# Patient Record
Sex: Male | Born: 1985
Health system: Southern US, Community
[De-identification: ages and names within clinical notes are randomized; demographics above are authoritative.]

---
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
## 2008-04-15 ENCOUNTER — Ambulatory Visit: Payer: Self-pay | Admitting: Family Medicine

## 2008-04-23 ENCOUNTER — Ambulatory Visit: Payer: Self-pay | Admitting: Internal Medicine

## 2009-03-14 ENCOUNTER — Emergency Department: Payer: Self-pay | Admitting: Emergency Medicine

## 2010-11-18 ENCOUNTER — Encounter: Payer: Self-pay | Admitting: Family Medicine

## 2010-11-18 ENCOUNTER — Ambulatory Visit
Admission: RE | Admit: 2010-11-18 | Discharge: 2010-11-18 | Payer: Self-pay | Source: Home / Self Care | Attending: Family Medicine | Admitting: Family Medicine

## 2010-12-22 NOTE — Letter (Signed)
Summary: [facility] form  [facility] form   Imported By: Dorna Leitz 11/19/2010 14:14:04  _____________________________________________________________________  External Attachment:    Type:   Image     Comment:   External Document

## 2010-12-22 NOTE — Assessment & Plan Note (Signed)
Summary: PPD Results  Nurse Visit   PPD Results    Date of reading: 11/18/2010    Results: < 5mm    Interpretation: negative

## 2021-05-03 ENCOUNTER — Other Ambulatory Visit: Payer: Self-pay

## 2021-05-03 ENCOUNTER — Encounter: Payer: Self-pay | Admitting: *Deleted

## 2021-05-03 ENCOUNTER — Emergency Department: Payer: BC Managed Care – PPO

## 2021-05-03 ENCOUNTER — Emergency Department
Admission: EM | Admit: 2021-05-03 | Discharge: 2021-05-03 | Disposition: A | Discharge status: Home / Self Care | Payer: BC Managed Care – PPO | Attending: Emergency Medicine | Admitting: Emergency Medicine

## 2021-05-03 DIAGNOSIS — S51852A Open bite of left forearm, initial encounter: Secondary | ICD-10-CM | POA: Insufficient documentation

## 2021-05-03 DIAGNOSIS — W540XXA Bitten by dog, initial encounter: Secondary | ICD-10-CM | POA: Diagnosis not present

## 2021-05-03 DIAGNOSIS — S8991XA Unspecified injury of right lower leg, initial encounter: Secondary | ICD-10-CM | POA: Diagnosis not present

## 2021-05-03 DIAGNOSIS — S60812A Abrasion of left wrist, initial encounter: Secondary | ICD-10-CM | POA: Diagnosis not present

## 2021-05-03 DIAGNOSIS — Y9301 Activity, walking, marching and hiking: Secondary | ICD-10-CM | POA: Diagnosis not present

## 2021-05-03 DIAGNOSIS — S59912A Unspecified injury of left forearm, initial encounter: Secondary | ICD-10-CM | POA: Diagnosis present

## 2021-05-03 DIAGNOSIS — M25561 Pain in right knee: Secondary | ICD-10-CM

## 2021-05-03 DIAGNOSIS — Z23 Encounter for immunization: Secondary | ICD-10-CM | POA: Diagnosis not present

## 2021-05-03 MED ORDER — HYDROCODONE-ACETAMINOPHEN 5-325 MG PO TABS: 1.0000 | ORAL_TABLET | Freq: Four times a day (QID) | ORAL | 0 refills | Status: AC | PRN

## 2021-05-03 MED ORDER — BACITRACIN ZINC 500 UNIT/GM EX OINT
TOPICAL_OINTMENT | Freq: Two times a day (BID) | CUTANEOUS | Status: DC
  Administered 2021-05-03: 1 via TOPICAL
  Filled 2021-05-03: qty 0.9

## 2021-05-03 MED ORDER — DOXYCYCLINE MONOHYDRATE 100 MG PO TABS: 100.0000 mg | ORAL_TABLET | Freq: Two times a day (BID) | ORAL | 0 refills | Status: AC

## 2021-05-03 MED ORDER — TETANUS-DIPHTH-ACELL PERTUSSIS 5-2.5-18.5 LF-MCG/0.5 IM SUSY
0.5000 mL | PREFILLED_SYRINGE | Freq: Once | INTRAMUSCULAR | Status: AC
  Administered 2021-05-03: 0.5 mL via INTRAMUSCULAR
  Filled 2021-05-03: qty 0.5

## 2021-05-03 MED ORDER — HYDROCODONE-ACETAMINOPHEN 5-325 MG PO TABS
1.0000 | ORAL_TABLET | ORAL | Status: AC
  Administered 2021-05-03: 1 via ORAL
  Filled 2021-05-03: qty 1

## 2021-05-03 MED ORDER — METRONIDAZOLE 500 MG PO TABS: 500.0000 mg | ORAL_TABLET | Freq: Three times a day (TID) | ORAL | 0 refills | Status: AC

## 2021-05-03 MED ORDER — DOXYCYCLINE HYCLATE 100 MG PO TABS
100.0000 mg | ORAL_TABLET | Freq: Once | ORAL | Status: AC
  Administered 2021-05-03: 100 mg via ORAL
  Filled 2021-05-03: qty 1

## 2021-05-03 MED ORDER — METRONIDAZOLE 500 MG PO TABS
500.0000 mg | ORAL_TABLET | Freq: Once | ORAL | Status: AC
  Administered 2021-05-03: 500 mg via ORAL
  Filled 2021-05-03: qty 1

## 2021-05-03 NOTE — ED Notes (Signed)
Pt reports dog bite while out walking, st incident has been reported to [employer] sheriff's department

## 2021-05-03 NOTE — ED Provider Notes (Signed)
[facility] EMERGENCY DEPARTMENT Provider Note   CSN: [id number] Arrival date & time: 05/03/21  2125     History Chief Complaint  Patient presents with   Animal Bite    Stephen Colon is a 35 y.o. male presents to the emergency department for evaluation of dog bite to the left forearm.  Just earlier today patient was going for a jog, was attacked by a pit bull.  The dog lunged and bit his left forearm.  He has some abrasions to the dorsal aspect of the left wrist and distal forearm.  No swelling, numbness or tingling.  Tetanus status unknown.  Dog returned a couple more times in the process patient was running and injured his right knee.  Denies falling or landing on the knee, was able to continue walking due to adrenaline but is currently stating the pain in the knee is very severe he has a hard time walking and is having some swelling.  His pain is located along the patella region.  He is able to straight leg raise.  Denies any other injury to his body.  No numbness or tingling.  He has not any medications for pain.  HPI     No past medical history on file.  There are no problems to display for this patient.     No family history on file.  Social History   Tobacco Use   Smoking status: Never   Smokeless tobacco: Never    Home Medications Prior to Admission medications   Medication Sig Start Date End Date Taking? Authorizing Provider  doxycycline (ADOXA) 100 MG tablet Take 1 tablet (100 mg total) by mouth 2 (two) times daily for 7 days. 05/03/21 05/10/21 Yes Evon Slack, PA-C  HYDROcodone-acetaminophen (NORCO) 5-325 MG tablet Take 1 tablet by mouth every 6 (six) hours as needed for moderate pain. 05/03/21  Yes Evon Slack, PA-C  metroNIDAZOLE (FLAGYL) 500 MG tablet Take 1 tablet (500 mg total) by mouth 3 (three) times daily for 7 days. 05/03/21 05/10/21 Yes Evon Slack, PA-C    Allergies    Keflex [cephalexin] and Penicillins  Review of  Systems   Review of Systems  Constitutional:  Negative for fever.  Gastrointestinal:  Negative for nausea and vomiting.  Musculoskeletal:  Positive for arthralgias, gait problem and joint swelling.  Skin:  Positive for wound. Negative for color change and pallor.  Neurological:  Negative for numbness and headaches.   Physical Exam Updated Vital Signs BP (!) 145/78   Pulse 79   Temp 99.2 F (37.3 C) (Oral)   Resp 20   Ht 6\' 2"  (1.88 m)   Wt 120.2 kg   SpO2 98%   BMI 34.02 kg/m   Physical Exam Constitutional:      Appearance: He is well-developed.  HENT:     Head: Normocephalic and atraumatic.  Eyes:     Conjunctiva/sclera: Conjunctivae normal.  Cardiovascular:     Rate and Rhythm: Normal rate.  Pulmonary:     Effort: Pulmonary effort is normal. No respiratory distress.  Musculoskeletal:     Cervical back: Normal range of motion.     Comments: Examination of the left forearm shows 3 inch wide area of abrasions with small superficial puncture wounds to the dorsal aspect of the left distal forearm.  Mild soft tissue swelling and tenderness noted.  Full range of motion of the extensor tendons.  Compartments are soft. Examination of the right knee shows swelling with ability  to straight leg raise but with a lot of pain.  He is tender along the dorsal aspect of the patella as well as just superior to the patella along the quad tendon.  There is no palpable defect of the quad tendon.  Patellar tendon is intact.  He is only able to flex knee past 30 degrees without causing severe pain.  He has no tenderness along the mid femur up to the hip.  Neurovascular intact in right lower extremity.  Skin:    General: Skin is warm.     Findings: No rash.  Neurological:     Mental Status: He is alert and oriented to person, place, and time.  Psychiatric:        Behavior: Behavior normal.        Thought Content: Thought content normal.    ED Results / Procedures / Treatments   Labs (all  labs ordered are listed, but only abnormal results are displayed) Labs Reviewed - No data to display  EKG None  Radiology DG Forearm Left  Result Date: 05/03/2021 CLINICAL DATA:  Dog bite to forearm EXAM: LEFT FOREARM - 2 VIEW COMPARISON:  None. FINDINGS: There is no evidence of fracture or other focal bone lesions. Soft tissues are unremarkable. No radiopaque foreign body or soft tissue gas. IMPRESSION: Negative. Electronically Signed   By: Charlett Nose M.D.   On: 05/03/2021 22:32   DG Knee Complete 4 Views Right  Result Date: 05/03/2021 CLINICAL DATA:  Right knee pain, trauma EXAM: RIGHT KNEE - COMPLETE 4+ VIEW COMPARISON:  None. FINDINGS: No evidence of fracture, dislocation, or joint effusion. No evidence of arthropathy or other focal bone abnormality. Soft tissues are unremarkable. IMPRESSION: Negative. Electronically Signed   By: Charlett Nose M.D.   On: 05/03/2021 22:32    Procedures   Medications Ordered in ED Medications  bacitracin ointment (has no administration in time range)  metroNIDAZOLE (FLAGYL) tablet 500 mg (has no administration in time range)  Tdap (BOOSTRIX) injection 0.5 mL (0.5 mLs Intramuscular Given 05/03/21 2228)  doxycycline (VIBRA-TABS) tablet 100 mg (100 mg Oral Given 05/03/21 2227)  HYDROcodone-acetaminophen (NORCO/VICODIN) 5-325 MG per tablet 1 tablet (1 tablet Oral Given 05/03/21 2227)    ED Course  I have reviewed the triage vital signs and the nursing notes.  Pertinent labs & imaging results that were available during my care of the patient were reviewed by me and considered in my medical decision making (see chart for details).    MDM Rules/Calculators/A&P                          35 year old male with dog bite earlier today to the left forearm.  Superficial abrasions to the dorsal aspect of the forearm.  X-rays negative.  No foreign body.  Extensor mechanism is intact.  Patient tetanus updated, patient started on metronidazole and doxycycline.   During the struggle with the dog he injured his right knee.  X-rays negative and extensor mechanisms intact.  No ligamentous laxity but having some pain throughout the knee.  We will give him crutches, recommend rest ice elevation.  He will follow-up with his orthopedist at [facility].  He understands signs symptoms return to the ER for.  Animal control has been notified and dog is going to be able to be quarantined. Final Clinical Impression(s) / ED Diagnoses Final diagnoses:  Dog bite, initial encounter  Dog bite of left forearm, initial encounter  Acute pain of right  knee    Rx / DC Orders ED Discharge Orders          Ordered    doxycycline (ADOXA) 100 MG tablet  2 times daily        05/03/21 2256    HYDROcodone-acetaminophen (NORCO) 5-325 MG tablet  Every 6 hours PRN        05/03/21 2256    metroNIDAZOLE (FLAGYL) 500 MG tablet  3 times daily        05/03/21 2256             Ronnette Juniper 05/03/21 2307    Phineas Semen, MD 05/04/21 1555

## 2021-05-03 NOTE — ED Triage Notes (Signed)
Pt has superficial dog bite to right arm.  Pt states he was on a walk and a pitbull attacked him.  Pt has right knee pain.  Pt alert

## 2021-05-03 NOTE — Discharge Instructions (Addendum)
Please apply antibiotic ointment to the left forearm daily.  Use crutches as needed for ambulation, rest ice and elevate the right knee.  You may work on gentle knee range of motion as swelling and pain allows.  You may discontinue crutches if you are able to walk without a limp in the next few days.  Please call your orthopedist or Dr. Joice Lofts office in 1 to 2 days to schedule follow-up appointment.  Return to the ER for any worsening symptoms or any changes in your health such as increased pain, swelling, warmth, redness

## 2022-01-30 ENCOUNTER — Other Ambulatory Visit: Payer: Self-pay

## 2022-01-30 ENCOUNTER — Ambulatory Visit
Admission: EM | Admit: 2022-01-30 | Discharge: 2022-01-30 | Disposition: A | Discharge status: Home / Self Care | Payer: BC Managed Care – PPO | Attending: Emergency Medicine | Admitting: Emergency Medicine

## 2022-01-30 ENCOUNTER — Ambulatory Visit: Payer: Self-pay

## 2022-01-30 DIAGNOSIS — L237 Allergic contact dermatitis due to plants, except food: Secondary | ICD-10-CM | POA: Diagnosis not present

## 2022-01-30 MED ORDER — PREDNISONE 10 MG (21) PO TBPK: ORAL_TABLET | Freq: Every day | ORAL | 0 refills | Status: AC

## 2022-01-30 NOTE — Discharge Instructions (Signed)
Take  prednisone every morning with food acute on packaging, this medicine is to reduce inflammation which in turn will help clear the rash ? ?You may continue use of topical calamine to help with itching, you may also attempt use of oral Zyrtec or Claritin or Benadryl ? ?You may follow-up with urgent care if rash continues to persist or worsens ?

## 2022-01-30 NOTE — ED Triage Notes (Signed)
Pt presents for rash all over his arms.He thinks it may be poison ivy.  ?

## 2022-01-30 NOTE — ED Provider Notes (Signed)
?[facility] ? ? ? ?CSN: [id number] ?Arrival date & time: 01/30/22  1153 ? ? ?  ? ?History   ?Chief Complaint ?Chief Complaint  ?Patient presents with  ? Rash  ? ? ?HPI ?Stephen Colon is a 36 y.o. male.  ? ?Patient presents since with a pruritic erythematous blister like rash present on the bilateral arms for 1 week.  Rash is spread to the left chest wall.  Symptoms began after working in a wooded area with [employer] on.  Endorses that symptoms are worsening.  Has attempted use of calamine and which hazel which has been minimally helpful. ? ? ? ? ? ?History reviewed. No pertinent past medical history. ? ?There are no problems to display for this patient. ? ? ?History reviewed. No pertinent surgical history. ? ? ? ? ?Home Medications   ? ?Prior to Admission medications   ?Medication Sig Start Date End Date Taking? Authorizing Provider  ?HYDROcodone-acetaminophen (NORCO) 5-325 MG tablet Take 1 tablet by mouth every 6 (six) hours as needed for moderate pain. 05/03/21   Evon Slack, PA-C  ? ? ?Family History ?History reviewed. No pertinent family history. ? ?Social History ?Social History  ? ?Tobacco Use  ? Smoking status: Never  ? Smokeless tobacco: Never  ? ? ? ?Allergies   ?Keflex [cephalexin] and Penicillins ? ? ?Review of Systems ?Review of Systems  ?Constitutional: Negative.   ?Respiratory: Negative.    ?Cardiovascular: Negative.   ?Skin:  Positive for rash. Negative for color change, pallor and wound.  ?Neurological: Negative.   ? ? ?Physical Exam ?Triage Vital Signs ?ED Triage Vitals [01/30/22 1212]  ?Enc Vitals Group  ?   BP (!) 148/134  ?   Pulse Rate (!) 58  ?   Resp 16  ?   Temp 98.3 ?F (36.8 ?C)  ?   Temp Source Oral  ?   SpO2 100 %  ?   Weight   ?   Height   ?   Head Circumference   ?   Peak Flow   ?   Pain Score   ?   Pain Loc   ?   Pain Edu?   ?   Excl. in GC?   ? ?No data found. ? ?Updated Vital Signs ?BP (!) 139/96 (BP Location: Left Arm)   Pulse (!) 56   Temp 98.3 ?F  (36.8 ?C) (Oral)   Resp 16   SpO2 100%  ? ?Visual Acuity ?Right Eye Distance:   ?Left Eye Distance:   ?Bilateral Distance:   ? ?Right Eye Near:   ?Left Eye Near:    ?Bilateral Near:    ? ?Physical Exam ?Constitutional:   ?   Appearance: Normal appearance.  ?HENT:  ?   Head: Normocephalic.  ?Eyes:  ?   Extraocular Movements: Extraocular movements intact.  ?Pulmonary:  ?   Effort: Pulmonary effort is normal.  ?Skin: ?   Comments: Erythematous blistering papular rash present to the anterior and medial aspect of the left upper arm right forearm and left chest wall  ?Neurological:  ?   Mental Status: He is alert and oriented to person, place, and time. Mental status is at baseline.  ?Psychiatric:     ?   Mood and Affect: Mood normal.     ?   Behavior: Behavior normal.  ? ? ? ?UC Treatments / Results  ?Labs ?(all labs ordered are listed, but only abnormal results are displayed) ?Labs Reviewed - No data  to display ? ?EKG ? ? ?Radiology ?No results found. ? ?Procedures ?Procedures (including critical care time) ? ?Medications Ordered in UC ?Medications - No data to display ? ?Initial Impression / Assessment and Plan / UC Course  ?I have reviewed the triage vital signs and the nursing notes. ? ?Pertinent labs & imaging results that were available during my care of the patient were reviewed by me and considered in my medical decision making (see chart for details). ? ?Poison ivy dermatitis ? ?Prednisone 60 mg taper prescribed for management, recommended continued use of topical calamine and which able as well as oral antihistamine for management of itching, patient encouraged to cover rash to prevent spread in household, given strict precautions that if symptoms continue to persist or worsen to follow-up with urgent ?Final Clinical Impressions(s) / UC Diagnoses  ? ?Final diagnoses:  ?None  ? ?Discharge Instructions   ?None ?  ? ?ED Prescriptions   ?None ?  ? ?PDMP not reviewed this encounter. ?  ?Valinda Hoar,  NP ?01/30/22 1253 ? ?

## 2023-01-11 IMAGING — DX DG KNEE COMPLETE 4+V*R*
4 series · 4 of 4 positions shown · non-contrast
Comparison: None.

CLINICAL DATA: Right knee pain, trauma

EXAM:
RIGHT KNEE - COMPLETE 4+ VIEW

[knee ap]
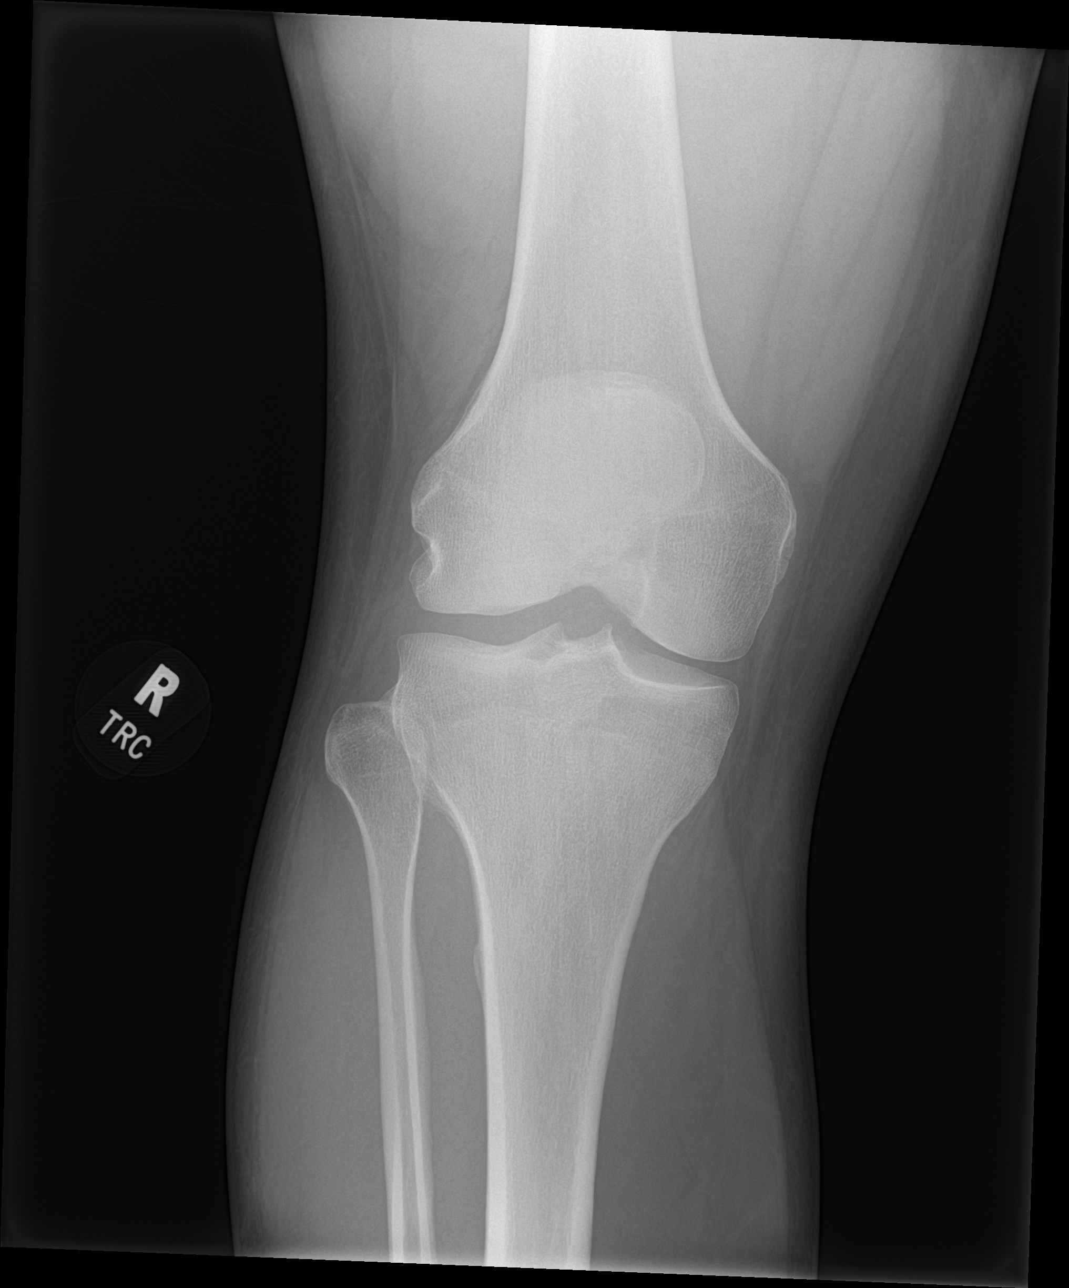

[knee obl (1 of 2)]
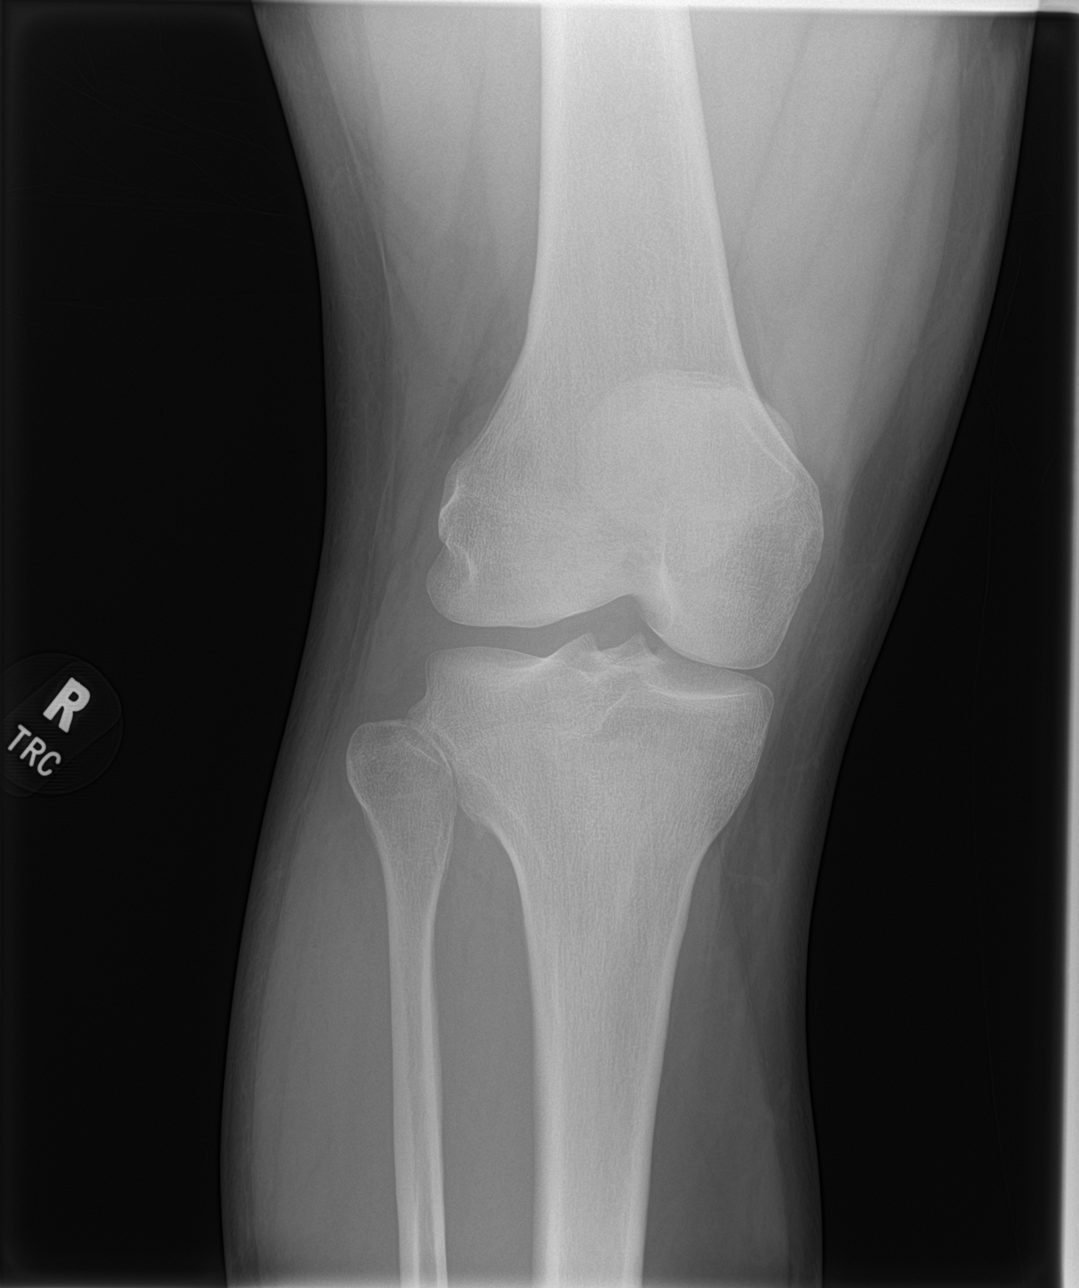

[knee obl (2 of 2)]
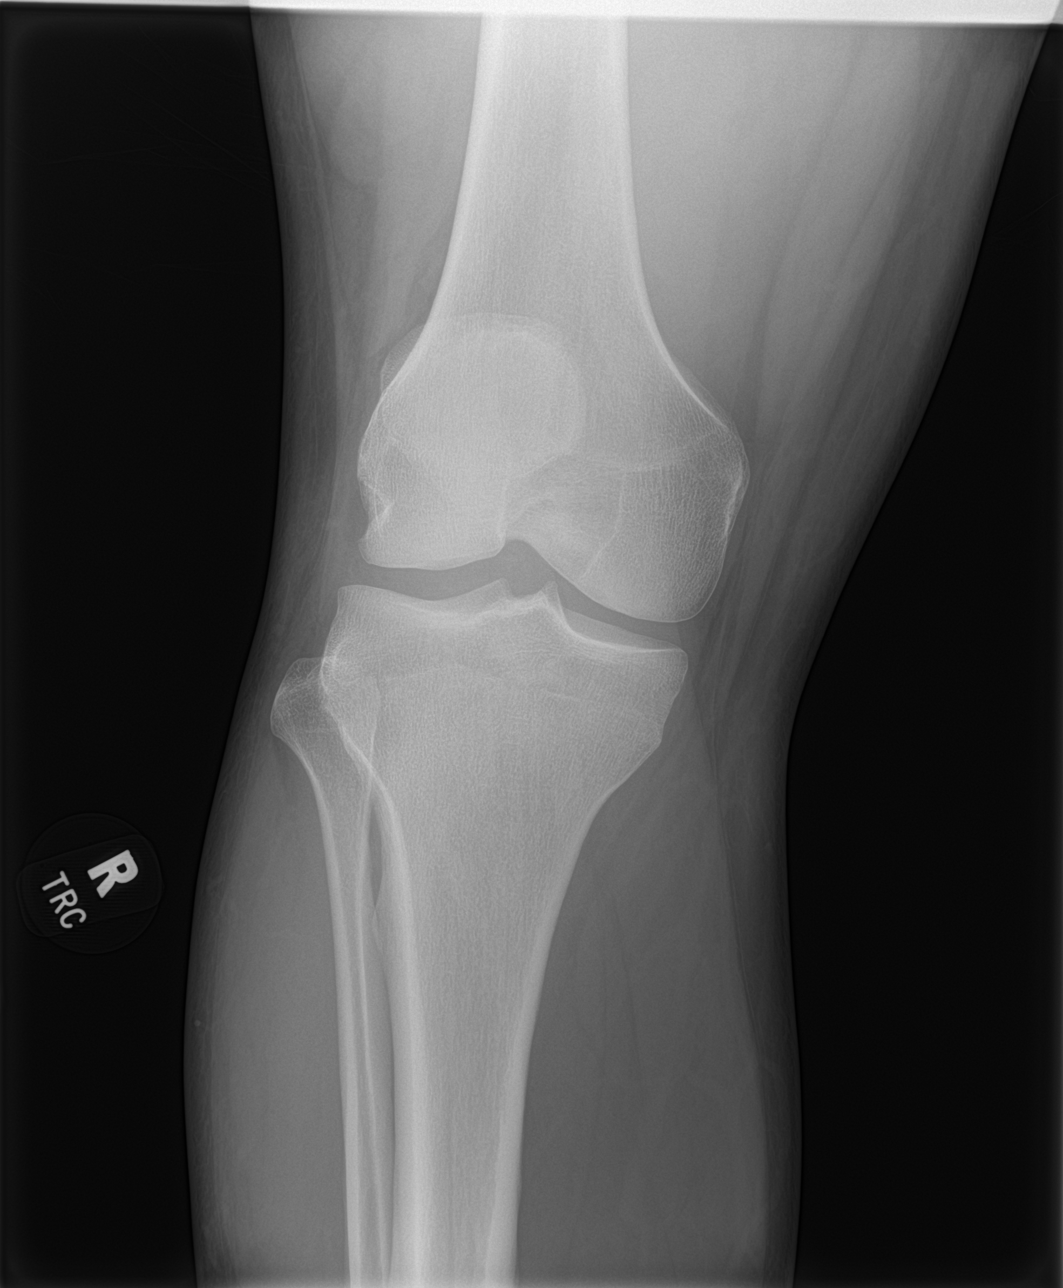

[knee lat]
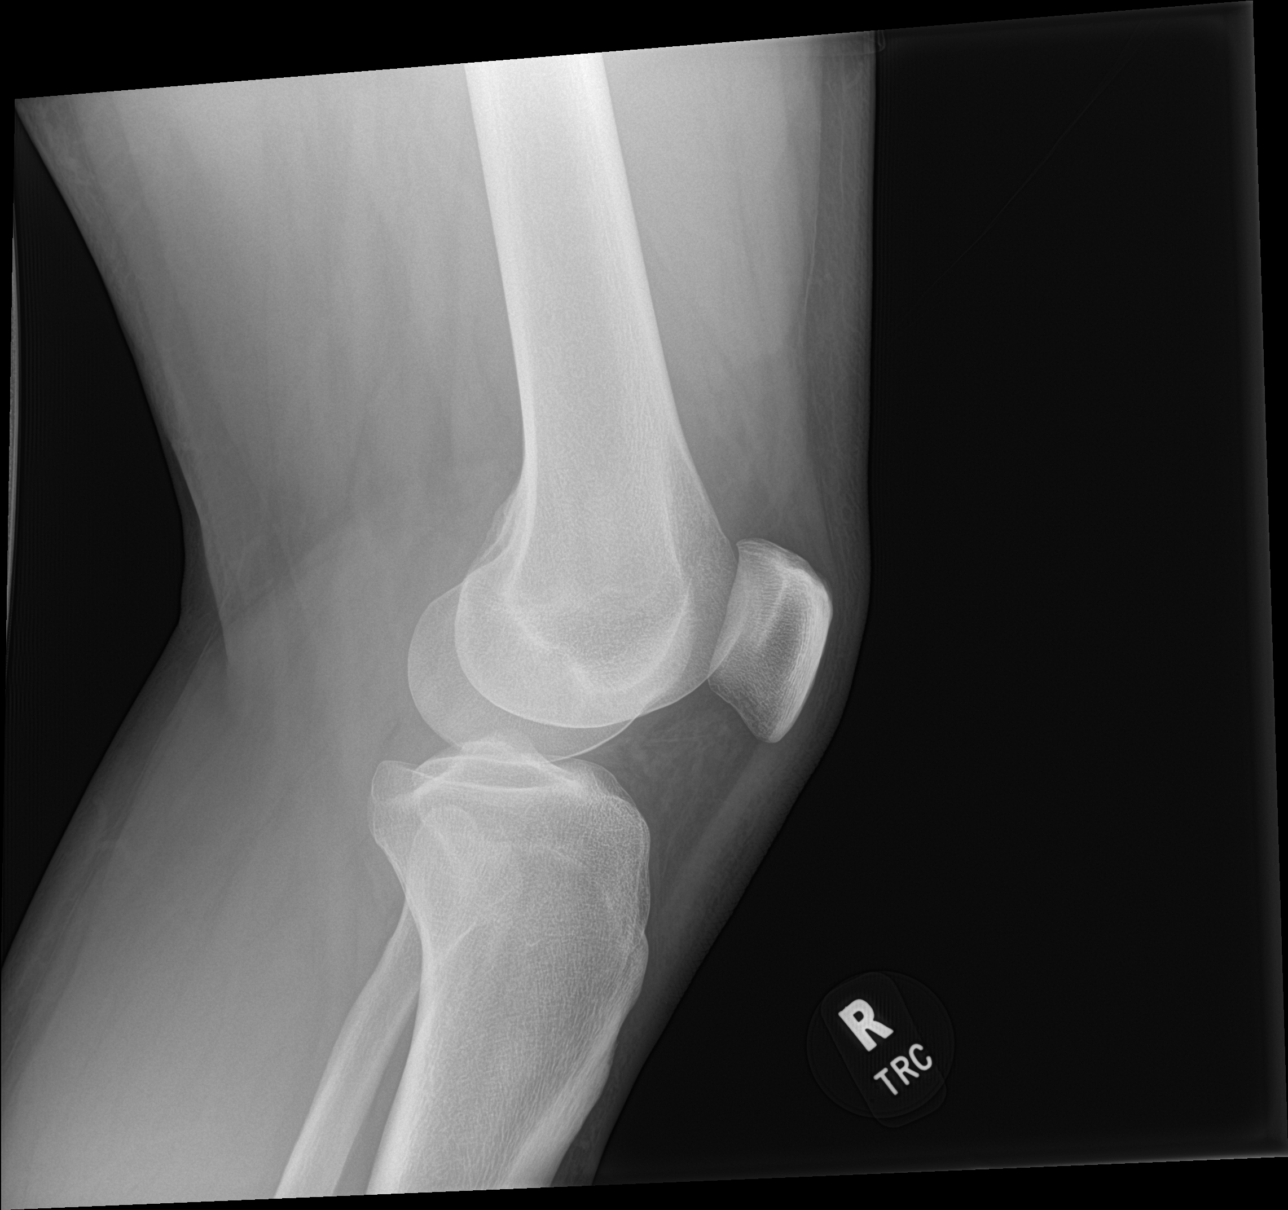

[4 of 4 positions shown; findings below may reference images not displayed]

FINDINGS: No evidence of fracture, dislocation, or joint effusion. No evidence
of arthropathy or other focal bone abnormality. Soft tissues are
unremarkable.
IMPRESSION: Negative.

## 2023-01-11 IMAGING — DX DG FOREARM 2V*L*
2 series · 2 of 2 positions shown · non-contrast
Comparison: None.

CLINICAL DATA: Dog bite to forearm

EXAM:
LEFT FOREARM - 2 VIEW

[forearm ap]
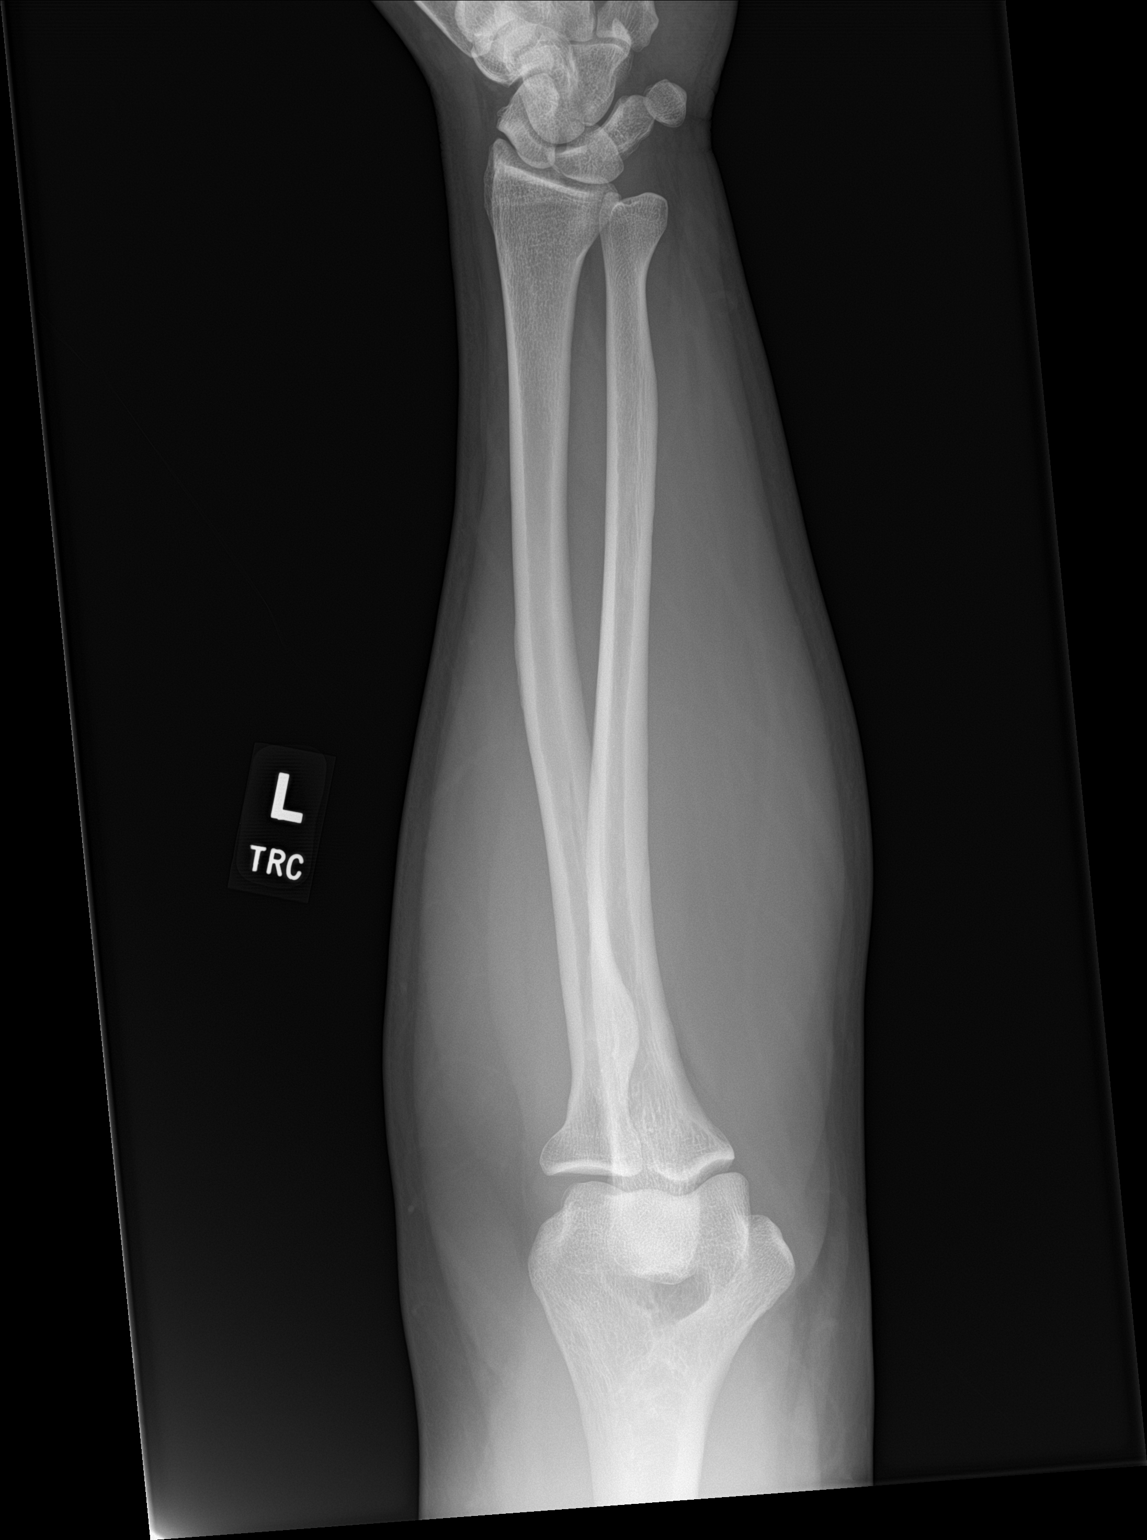

[forearm lat]
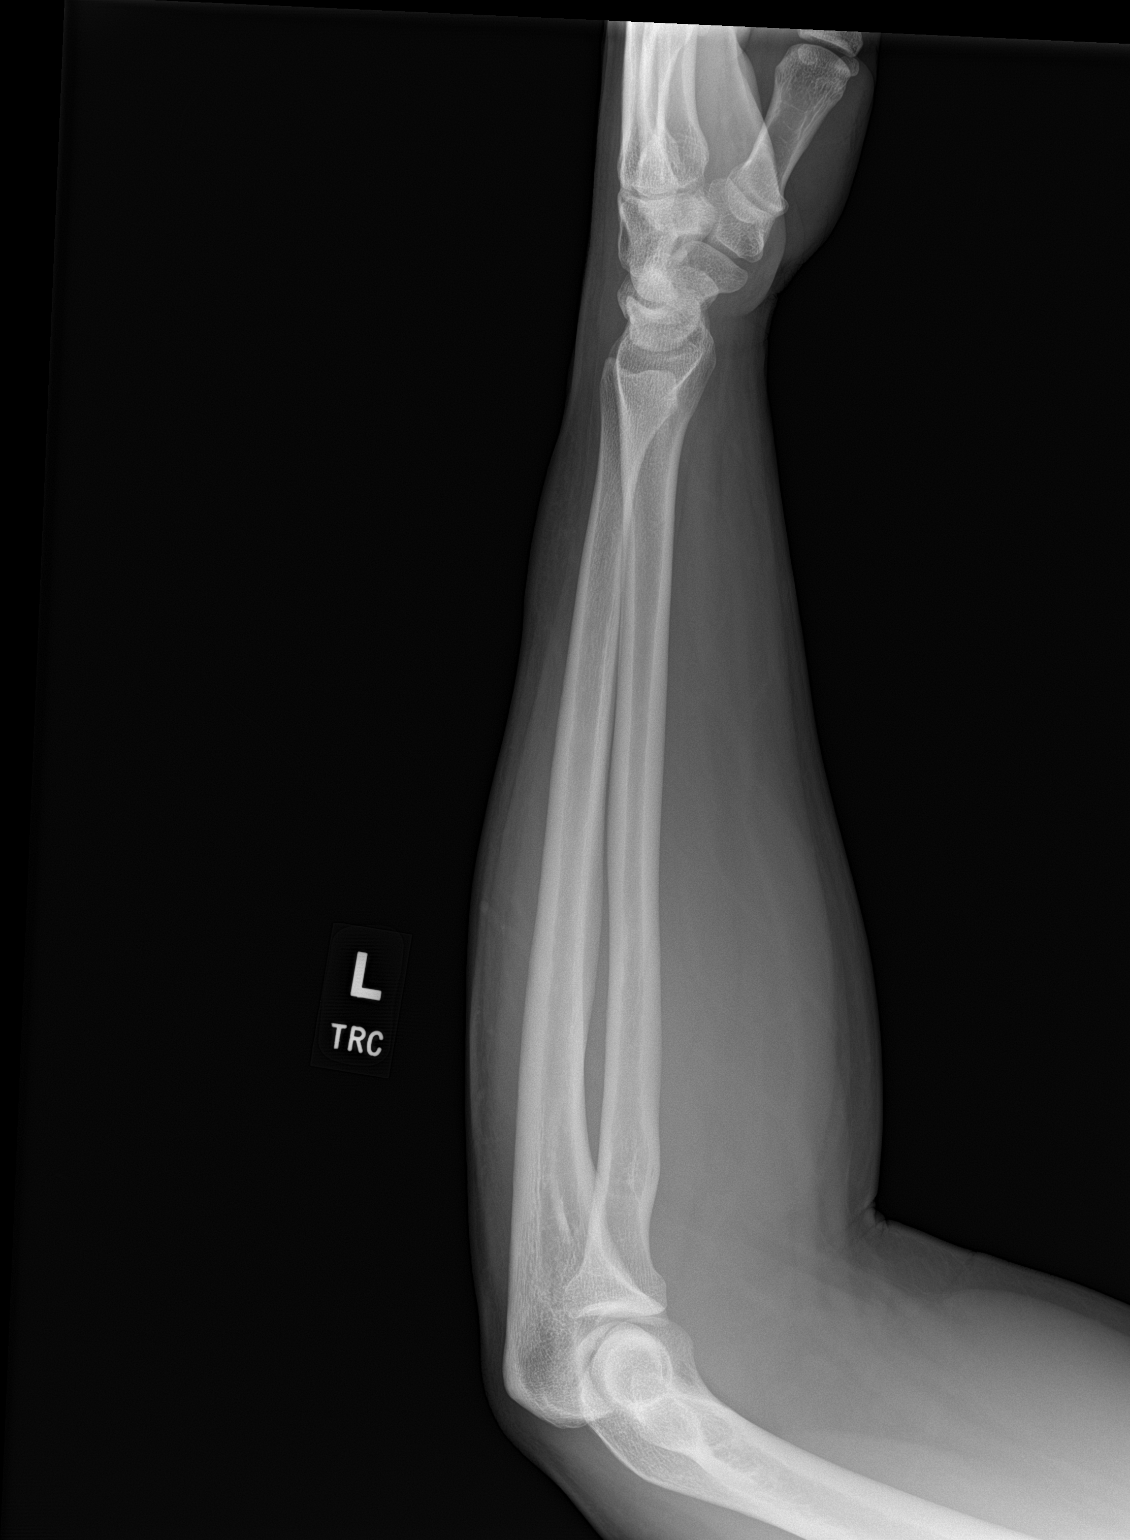

[2 of 2 positions shown; findings below may reference images not displayed]

FINDINGS: There is no evidence of fracture or other focal bone lesions. Soft
tissues are unremarkable. No radiopaque foreign body or soft tissue
gas.
IMPRESSION: Negative.
